# Patient Record
Sex: Male | Born: 1980 | Race: Black or African American | Hispanic: No | Marital: Single | Smoking: Never smoker
Health system: Southern US, Community
[De-identification: ages and names within clinical notes are randomized; demographics above are authoritative.]

---

## 2008-03-03 ENCOUNTER — Emergency Department (HOSPITAL_COMMUNITY): Admission: EM | Admit: 2008-03-03 | Discharge: 2008-03-03 | Payer: Self-pay | Admitting: Emergency Medicine

## 2009-02-15 ENCOUNTER — Emergency Department (HOSPITAL_COMMUNITY): Admission: EM | Admit: 2009-02-15 | Discharge: 2009-02-15 | Payer: Self-pay | Admitting: Emergency Medicine
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
## 2011-07-09 ENCOUNTER — Other Ambulatory Visit: Payer: Self-pay | Admitting: Family Medicine

## 2011-07-09 DIAGNOSIS — E049 Nontoxic goiter, unspecified: Secondary | ICD-10-CM

## 2011-07-11 ENCOUNTER — Ambulatory Visit
Admission: RE | Admit: 2011-07-11 | Discharge: 2011-07-11 | Disposition: A | Discharge status: Home / Self Care | Payer: 59 | Source: Ambulatory Visit | Attending: Family Medicine | Admitting: Family Medicine

## 2011-07-11 DIAGNOSIS — E049 Nontoxic goiter, unspecified: Secondary | ICD-10-CM

## 2015-09-15 ENCOUNTER — Ambulatory Visit (INDEPENDENT_AMBULATORY_CARE_PROVIDER_SITE_OTHER): Payer: 59 | Admitting: Family Medicine

## 2015-09-15 ENCOUNTER — Other Ambulatory Visit: Payer: Self-pay | Admitting: Family Medicine

## 2015-09-15 VITALS — BP 134/80 | HR 93 | Temp 98.6°F | Resp 18 | Ht 77.75 in | Wt 321.0 lb

## 2015-09-15 DIAGNOSIS — R369 Urethral discharge, unspecified: Secondary | ICD-10-CM | POA: Diagnosis not present

## 2015-09-15 DIAGNOSIS — J028 Acute pharyngitis due to other specified organisms: Secondary | ICD-10-CM

## 2015-09-15 DIAGNOSIS — J029 Acute pharyngitis, unspecified: Secondary | ICD-10-CM | POA: Insufficient documentation

## 2015-09-15 MED ORDER — CEFTRIAXONE SODIUM 1 G IJ SOLR
250.0000 mg | Freq: Once | INTRAMUSCULAR | Status: AC
  Administered 2015-09-15: 250 mg via INTRAMUSCULAR

## 2015-09-15 MED ORDER — AZITHROMYCIN 250 MG PO TABS: ORAL_TABLET | ORAL | Status: DC

## 2015-09-15 NOTE — Patient Instructions (Addendum)
Thank you for coming in today. Take azithromycin once for penile discharge.. Use Tylenol as needed for sore throat We will call with results.  Pharyngitis Pharyngitis is redness, pain, and swelling (inflammation) of your pharynx.  CAUSES  Pharyngitis is usually caused by infection. Most of the time, these infections are from viruses (viral) and are part of a cold. However, sometimes pharyngitis is caused by bacteria (bacterial). Pharyngitis can also be caused by allergies. Viral pharyngitis may be spread from person to person by coughing, sneezing, and personal items or utensils (cups, forks, spoons, toothbrushes). Bacterial pharyngitis may be spread from person to person by more intimate contact, such as kissing.  SIGNS AND SYMPTOMS  Symptoms of pharyngitis include:   Sore throat.   Tiredness (fatigue).   Low-grade fever.   Headache.  Joint pain and muscle aches.  Skin rashes.  Swollen lymph nodes.  Plaque-like film on throat or tonsils (often seen with bacterial pharyngitis). DIAGNOSIS  Your health care provider will ask you questions about your illness and your symptoms. Your medical history, along with a physical exam, is often all that is needed to diagnose pharyngitis. Sometimes, a rapid strep test is done. Other lab tests may also be done, depending on the suspected cause.  TREATMENT  Viral pharyngitis will usually get better in 3-4 days without the use of medicine. Bacterial pharyngitis is treated with medicines that kill germs (antibiotics).  HOME CARE INSTRUCTIONS   Drink enough water and fluids to keep your urine clear or pale yellow.   Only take over-the-counter or prescription medicines as directed by your health care provider:   If you are prescribed antibiotics, make sure you finish them even if you start to feel better.   Do not take aspirin.   Get lots of rest.   Gargle with 8 oz of salt water ( tsp of salt per 1 qt of water) as often as every  1-2 hours to soothe your throat.   Throat lozenges (if you are not at risk for choking) or sprays may be used to soothe your throat. SEEK MEDICAL CARE IF:   You have large, tender lumps in your neck.  You have a rash.  You cough up green, yellow-brown, or bloody spit. SEEK IMMEDIATE MEDICAL CARE IF:   Your neck becomes stiff.  You drool or are unable to swallow liquids.  You vomit or are unable to keep medicines or liquids down.  You have severe pain that does not go away with the use of recommended medicines.  You have trouble breathing (not caused by a stuffy nose). MAKE SURE YOU:   Understand these instructions.  Will watch your condition.  Will get help right away if you are not doing well or get worse.   This information is not intended to replace advice given to you by your health care provider. Make sure you discuss any questions you have with your health care provider.   Document Released: 02/11/2005 Document Revised: 12/02/2012 Document Reviewed: 10/19/2012 Elsevier Interactive Patient Education 2016 [employer]vier Inc[employer]  Chlamydia, Male Chlamydia is an infection. It is spread through sexual contact. Chlamydia can be in different areas of the body. These areas include the urethra, throat, or rectum. It is important to treat chlamydia as soon as possible. It can damage other organs.  CAUSES  Chlamydia is caused by bacteria. It is a sexually transmitted disease. This means that it is passed from an infected partner during intimate contact. This contact could be with the genitals, mouth,  or rectal area.  SIGNS AND SYMPTOMS  There may not be any symptoms. This is often the case early in the infection. If there are symptoms, they are usually mild and may only be noticeable in the morning. Symptoms you may notice include:   Burning with urination.  Pain or swelling in the testicles.  Watery mucus-like discharge from the penis.  Long-standing (chronic) pelvic pain after  frequent infections.  Pain, swelling, or itching around the anus.  A sore throat.  Itching, burning, or redness in the eyes, or discharge from the eyes. DIAGNOSIS  To diagnose this infection, your health care provider will do a pelvic exam. A sample of urine or a swab from the rectum may be taken for testing.  TREATMENT  Chlamydia is treated with antibiotic medicines. Your health care provider may test you for infection again 3 months after treatment. HOME CARE INSTRUCTIONS  Take your antibiotic medicine as directed by your health care provider. Finish the antibiotic even if you start to feel better. Incomplete treatment will put you at risk for not being able to have children (sterility).   Take medicines only as directed by your health care provider.   Rest.   Inform any sexual partners about your infection. Even if they are symptom free or have a negative culture or evaluation, they should be treated for the condition.   Do not have sex (intercourse) until treatment is completed and your health care provider says it is okay.   Keep all follow-up visits as directed by your health care provider.   Not all test results are available during your visit. If your test results are not back during the visit, make an appointment with your health care provider to find out the results. Do not assume everything is normal if you have not heard from your health care provider or the medical facility. It is your responsibility to get your test results. SEEK MEDICAL CARE IF:  You develop new joint pain.  You have a fever. SEEK IMMEDIATE MEDICAL CARE IF:   Your pain increases.   You have abnormal discharge.   You have pain during intercourse. MAKE SURE YOU:   Understand these instructions.  Will watch your condition.  Will get help right away if you are not doing well or get worse.   This information is not intended to replace advice given to you by your health care provider.  Make sure you discuss any questions you have with your health care provider.   Document Released: 02/11/2005 Document Revised: 03/04/2014 Document Reviewed: 08/20/2012 Elsevier Interactive Patient Education 2016 [employer].   IF you received an x-ray today, you will receive an invoice from [facility]. Please contact [facility] at [phone] with questions or concerns regarding your invoice.   IF you received labwork today, you will receive an invoice from [employer]. Please contact [facility] at [phone] with questions or concerns regarding your invoice.   Our billing staff will not be able to assist you with questions regarding bills from these companies.  You will be contacted with the lab results as soon as they are available. The fastest way to get your results is to activate your My Chart account. Instructions are located on the last page of this paperwork. If you have not heard from [place] regarding the results in 2 weeks, please contact this office.

## 2015-09-15 NOTE — Addendum Note (Signed)
Addended by: Arlyss QueenPACK, Hisayo Delossantos D on: 09/15/2015 03:47 PM   Modules accepted: Kipp BroodSmartSet

## 2015-09-15 NOTE — Progress Notes (Signed)
    Ruben Guzman is a 35 y.o. male who [facility] sore throat and penile discharge.  Patient notes a 4 day history of mild sore throat associated with some body aches. He notes pain with swallowing. He denies any fevers or chills nausea vomiting or diarrhea.He has not really tried any medications yet. She denies any significant cough.    Additionally patient has a one-day history of clear penile discharge. He notes the discharge is similar to previous episodes of Chlamydia.He denies any rash or lesions. He has not tried any medications or treatment.   History reviewed. No pertinent past medical history. History reviewed. No pertinent past surgical history. Social History  Substance Use Topics  . Smoking status: Never Smoker   . Smokeless tobacco: Not on file  . Alcohol Use: No   ROS as above No headache, visual changes, nausea, vomiting, diarrhea, constipation, dizziness, abdominal pain, skin rash, fevers, chills, night sweats, weight loss, swollen lymph nodes, body aches, joint swelling, muscle aches, chest pain, shortness of breath, mood changes, visual or auditory hallucinations.   Medications: Current Outpatient Prescriptions  Medication Sig Dispense Refill  . azithromycin (ZITHROMAX) 250 MG tablet Take 1000mg  po once 4 tablet 0   Current Facility-Administered Medications  Medication Dose Route Frequency Provider Last Rate Last Dose  . cefTRIAXone (ROCEPHIN) injection 250 mg  250 mg Intramuscular Once Rodolph BongEvan S Braxdon Gappa, MD       No Known Allergies   Exam:  BP 134/80 mmHg  Pulse 93  Temp(Src) 98.6 F (37 C) (Oral)  Resp 18  Ht 6' 5.75" (1.975 m)  Wt 321 lb (145.605 kg)  BMI 37.33 kg/m2  SpO2 98% Gen: Well NAD HEENT: EOMI,  MMM Posterior pharynx with cobblestoning. No erythema or exudate. Lungs: Normal work of breathing. CTABL Heart: RRR no MRG Abd: NABS, Soft. Nondistended, Nontender Exts: Brisk capillary refill, warm and well perfused.   Genitals: No inguinal LAD Penis is circumcised. Clear discharge is present Testicles are descended and non-tender BL.  No lesions noted.   Patient was given 250mg  IV ceftriaxone IM prior to DC.    No results found for this or any previous visit (from the past 24 hour(s)). No results found.  Assessment and Plan: 35 y.o. male with   Pharyngitis: Likely viral. Plan for symptomatic management with over-the-counter medications.  Penile discharge: Concerning for Chlamydia or gonorrhea. Urine GC chlamydia pending. Serum HIV and syphilis testing are also pending. Empiric treatment with ceftriaxone 250 mg IM once in the clinic as well as 1 g of oral azithromycin sent to the pharmacy. Contact patient with results.  Discussed warning signs or symptoms. Please see discharge instructions. Patient expresses understanding.

## 2015-09-16 LAB — GC/CHLAMYDIA PROBE AMP
CT Probe RNA: NOT DETECTED
GC Probe RNA: DETECTED — AB

## 2015-09-16 LAB — HIV ANTIBODY (ROUTINE TESTING W REFLEX): HIV: NONREACTIVE

## 2015-09-16 LAB — RPR

## 2015-09-20 ENCOUNTER — Telehealth: Payer: Self-pay | Admitting: Emergency Medicine

## 2015-09-20 NOTE — Telephone Encounter (Signed)
-----   Message from Rodolph Bong, MD sent at 09/18/2015  7:17 AM EDT ----- Gonorrhea was positive. The injection in the clinic plus the pills at the pharmacy should take care of this. Please inform her sexual partners of this result.

## 2015-10-11 ENCOUNTER — Ambulatory Visit (INDEPENDENT_AMBULATORY_CARE_PROVIDER_SITE_OTHER): Payer: 59 | Admitting: Urgent Care

## 2015-10-11 ENCOUNTER — Ambulatory Visit (INDEPENDENT_AMBULATORY_CARE_PROVIDER_SITE_OTHER): Payer: 59

## 2015-10-11 VITALS — BP 128/84 | HR 90 | Temp 98.2°F | Resp 17 | Ht 77.75 in | Wt 325.0 lb

## 2015-10-11 DIAGNOSIS — S99911A Unspecified injury of right ankle, initial encounter: Secondary | ICD-10-CM | POA: Diagnosis not present

## 2015-10-11 DIAGNOSIS — Z8739 Personal history of other diseases of the musculoskeletal system and connective tissue: Secondary | ICD-10-CM

## 2015-10-11 DIAGNOSIS — Z8639 Personal history of other endocrine, nutritional and metabolic disease: Secondary | ICD-10-CM | POA: Diagnosis not present

## 2015-10-11 DIAGNOSIS — M25571 Pain in right ankle and joints of right foot: Secondary | ICD-10-CM | POA: Diagnosis not present

## 2015-10-11 LAB — URIC ACID: URIC ACID, SERUM: 9.2 mg/dL — AB (ref 4.0–8.0)

## 2015-10-11 MED ORDER — NAPROXEN SODIUM 550 MG PO TABS: 550.0000 mg | ORAL_TABLET | Freq: Two times a day (BID) | ORAL | 1 refills | Status: DC

## 2015-10-11 NOTE — Patient Instructions (Addendum)
RICE for Routine Care of Injuries Theroutine careofmanyinjuriesincludes rest, ice, compression, and elevation (RICE therapy). RICE therapy is often recommended for injuries to soft tissues, such as a muscle strain, ligament injuries, bruises, and overuse injuries. It can also be used for some bony injuries. Using RICE therapy can help to relieve pain, lessen swelling, and enable your body to heal. Rest Rest is required to allow your body to heal. This usually involves reducing your normal activities and avoiding use of the injured part of your body. Generally, you can return to your normal activities when you are comfortable and have been given permission by your health care provider. Ice Icing your injury helps to keep the swelling down, and it lessens pain. Do not apply ice directly to your skin.  Put ice in a plastic bag.  Place a towel between your skin and the bag.  Leave the ice on for 20 minutes, 2-3 times a day. Do this for as long as you are directed by your health care provider. Compression Compression means putting pressure on the injured area. Compression helps to keep swelling down, gives support, and helps with discomfort. Compression may be done with an elastic bandage. If an elastic bandage has been applied, follow these general tips:  Remove and reapply the bandage every 3-4 hours or as directed by your health care provider.  Make sure the bandage is not wrapped too tightly, because this can cut off circulation. If part of your body beyond the bandage becomes blue, numb, cold, swollen, or more painful, your bandage is most likely too tight. If this occurs, remove your bandage and reapply it more loosely.  See your health care provider if the bandage seems to be making your problems worse rather than better. Elevation Elevation means keeping the injured area raised. This helps to lessen swelling and decrease pain. If possible, your injured area should be elevated at or  above the level of your heart or the center of your chest. WHEN SHOULD I SEEK MEDICAL CARE? You should seek medical care if:  Your pain and swelling continue.  Your symptoms are getting worse rather than improving. These symptoms may indicate that further evaluation or further X-rays are needed. Sometimes, X-rays may not show a small broken bone (fracture) until a number of days later. Make a follow-up appointment with your health care provider. WHEN SHOULD I SEEK IMMEDIATE MEDICAL CARE? You should seek immediate medical care if:  You have sudden severe pain at or below the area of your injury.  You have redness or increased swelling around your injury.  You have tingling or numbness at or below the area of your injury that does not improve after you remove the elastic bandage.   This information is not intended to replace advice given to you by your health care provider. Make sure you discuss any questions you have with your health care provider.   Document Released: 05/26/2000 Document Revised: 11/02/2014 Document Reviewed: 01/19/2014 Elsevier Interactive Patient Education 2016 Elsevier Inc.     IF you received an x-ray today, you will receive an invoice from [facility]. Please contact [facility] at [phone] with questions or concerns regarding your invoice.   IF you received labwork today, you will receive an invoice from [facility]. Please contact [facility] at [phone] with questions or concerns regarding your invoice.   Our billing staff will not be able to assist you with questions regarding bills from these companies.  You will be contacted with   the lab results as soon as they are available. The fastest way to get your results is to activate your My Chart account. Instructions are located on the last page of this paperwork. If you have not heard from us regarding the results in 2 weeks, please contact this office.      

## 2015-10-11 NOTE — Progress Notes (Signed)
    MRN: [id number] DOB: 11/03/1980  Subjective:   Ruben Guzman is a 35 y.o. male presenting for right ankle pain.  Reports 1.5 week history of rolling his right ankle. He has had persistent pain of his right ankle, swelling. Pain is a throbbing type sensation, worse at the end of the day after work. Works 12 hour shifts, stands and walks for most of his shift. He has been trying Alleve without any significant difference. He has not used any wraps, icing, elevated his foot. Denies erythema, bruising, trauma. Denies drinking alcohol.   Ruben Guzman currently has no medications in their medication list. Also has No Known Allergies.  Ruben Guzman  has no past medical history on file. Also  has no past surgical history on file.  Objective:   Vitals: BP (!) 138/96 (BP Location: Right Arm, Patient Position: Sitting, Cuff Size: Large)   Pulse 90   Temp 98.2 F (36.8 C) (Oral)   Resp 17   Ht 6' 5.75" (1.975 m)   Wt (!) 325 lb (147.4 kg)   SpO2 99%   BMI 37.80 kg/m   Physical Exam  Constitutional: He is oriented to person, place, and time. He appears well-developed and well-nourished.  Cardiovascular: Normal rate.   Pulmonary/Chest: Effort normal.  Musculoskeletal:       Right ankle: He exhibits normal range of motion, no swelling, no ecchymosis, no deformity, no laceration and normal pulse. Tenderness (over area depicted, there is also slight warmth compared to his left ankle). No lateral malleolus, no medial malleolus, no AITFL, no CF ligament, no posterior TFL, no head of 5th metatarsal and no proximal fibula tenderness found. Achilles tendon exhibits no pain and no defect.       Feet:  Neurological: He is alert and oriented to person, place, and time. Coordination (walking with limp favoring right foot) abnormal.  Skin: Skin is warm and dry.   Dg Ankle Complete Right  Result Date: 10/11/2015 CLINICAL DATA:  Pain.  Initial evaluation. EXAM: RIGHT ANKLE - COMPLETE 3+ VIEW COMPARISON:  No recent  prior . FINDINGS: No acute bony or joint abnormality identified. No evidence fracture dislocation. IMPRESSION: No acute abnormality. Electronically Signed   By: Maisie Fushomas  Register   On: 10/11/2015 11:33    Assessment and Plan :   1. Right ankle pain 2. Right ankle injury, initial encounter 3. History of gout - Labs pending. Radiographs were negative. Suspect ankle tendinitis versus mild ankle sprain versus (less likely) gout. Will manage conservatively. RTC in 2 weeks if no improvement.  Wallis BambergMario Alaina Donati, PA-C [facility] [phone] 10/11/2015 10:59 AM

## 2015-10-11 NOTE — Addendum Note (Signed)
Addended by: Wallis BambergMANI, Tyshan Enderle on: 10/11/2015 11:51 AM   Modules accepted: Orders

## 2015-10-12 ENCOUNTER — Other Ambulatory Visit: Payer: Self-pay | Admitting: Urgent Care

## 2015-10-12 ENCOUNTER — Encounter: Payer: Self-pay | Admitting: Urgent Care

## 2015-10-12 DIAGNOSIS — M109 Gout, unspecified: Secondary | ICD-10-CM

## 2015-10-12 LAB — CBC
HEMATOCRIT: 44.5 % (ref 38.5–50.0)
Hemoglobin: 15.3 g/dL (ref 13.2–17.1)
MCH: 31 pg (ref 27.0–33.0)
MCHC: 34.4 g/dL (ref 32.0–36.0)
MCV: 90.1 fL (ref 80.0–100.0)
MPV: 10.4 fL (ref 7.5–12.5)
Platelets: 198 10*3/uL (ref 140–400)
RBC: 4.94 MIL/uL (ref 4.20–5.80)
RDW: 13.9 % (ref 11.0–15.0)
WBC: 6 10*3/uL (ref 3.8–10.8)

## 2015-10-12 LAB — SEDIMENTATION RATE: Sed Rate: 5 mm/hr (ref 0–15)

## 2015-10-12 MED ORDER — INDOMETHACIN 50 MG PO CAPS: 50.0000 mg | ORAL_CAPSULE | Freq: Three times a day (TID) | ORAL | 6 refills | Status: DC

## 2017-08-07 IMAGING — DX DG ANKLE COMPLETE 3+V*R*
4 series · 4 of 4 positions shown · non-contrast
Comparison: No recent prior .

CLINICAL DATA: Pain.  Initial evaluation.

EXAM:
RIGHT ANKLE - COMPLETE 3+ VIEW

[ankle ap]
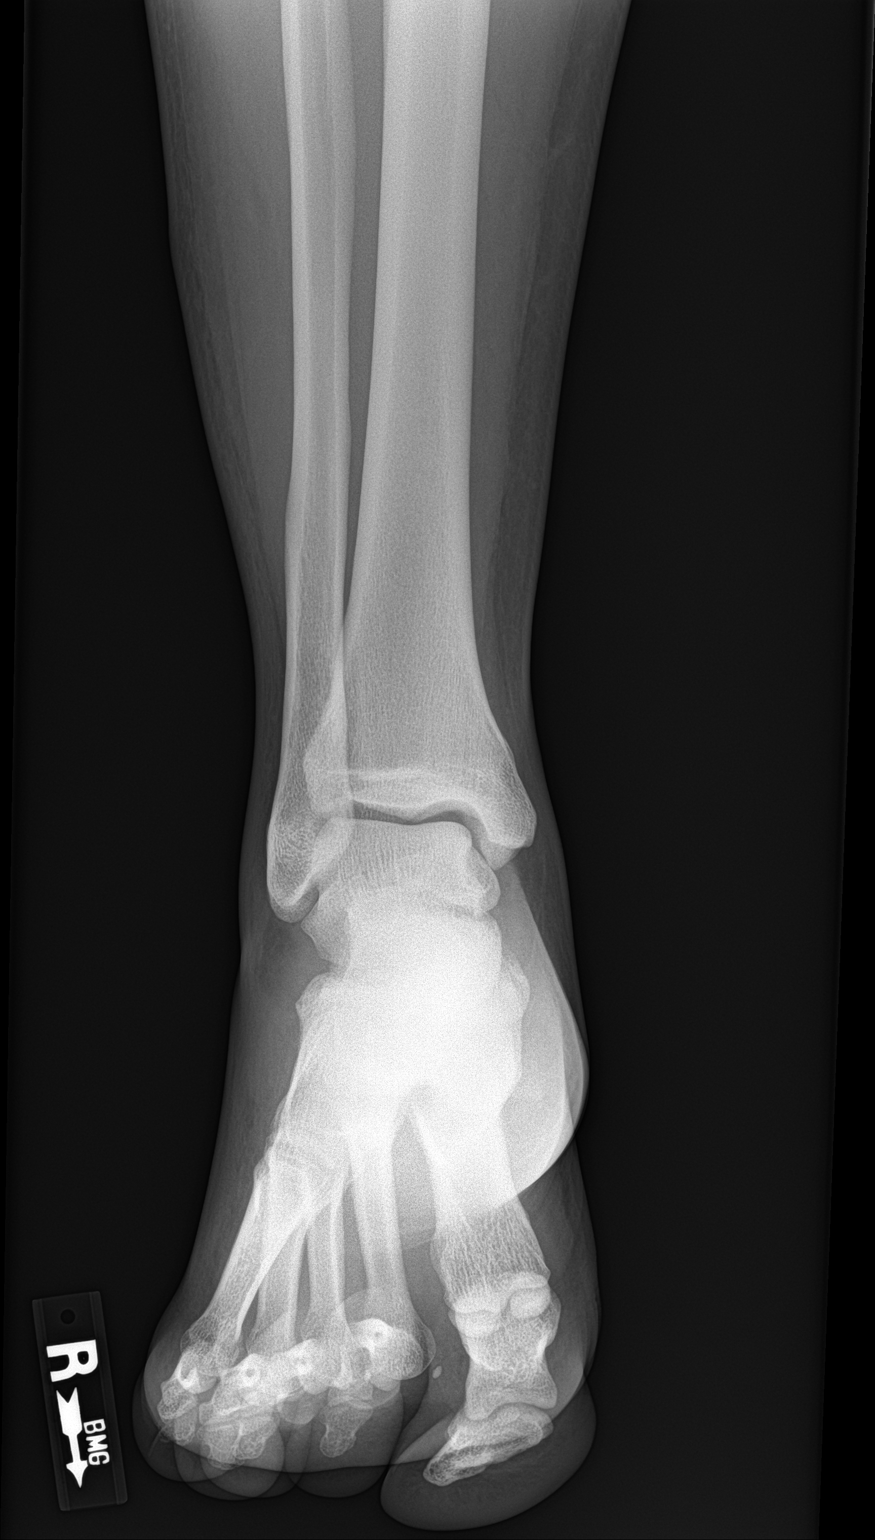

[ankle obl (1 of 2)]
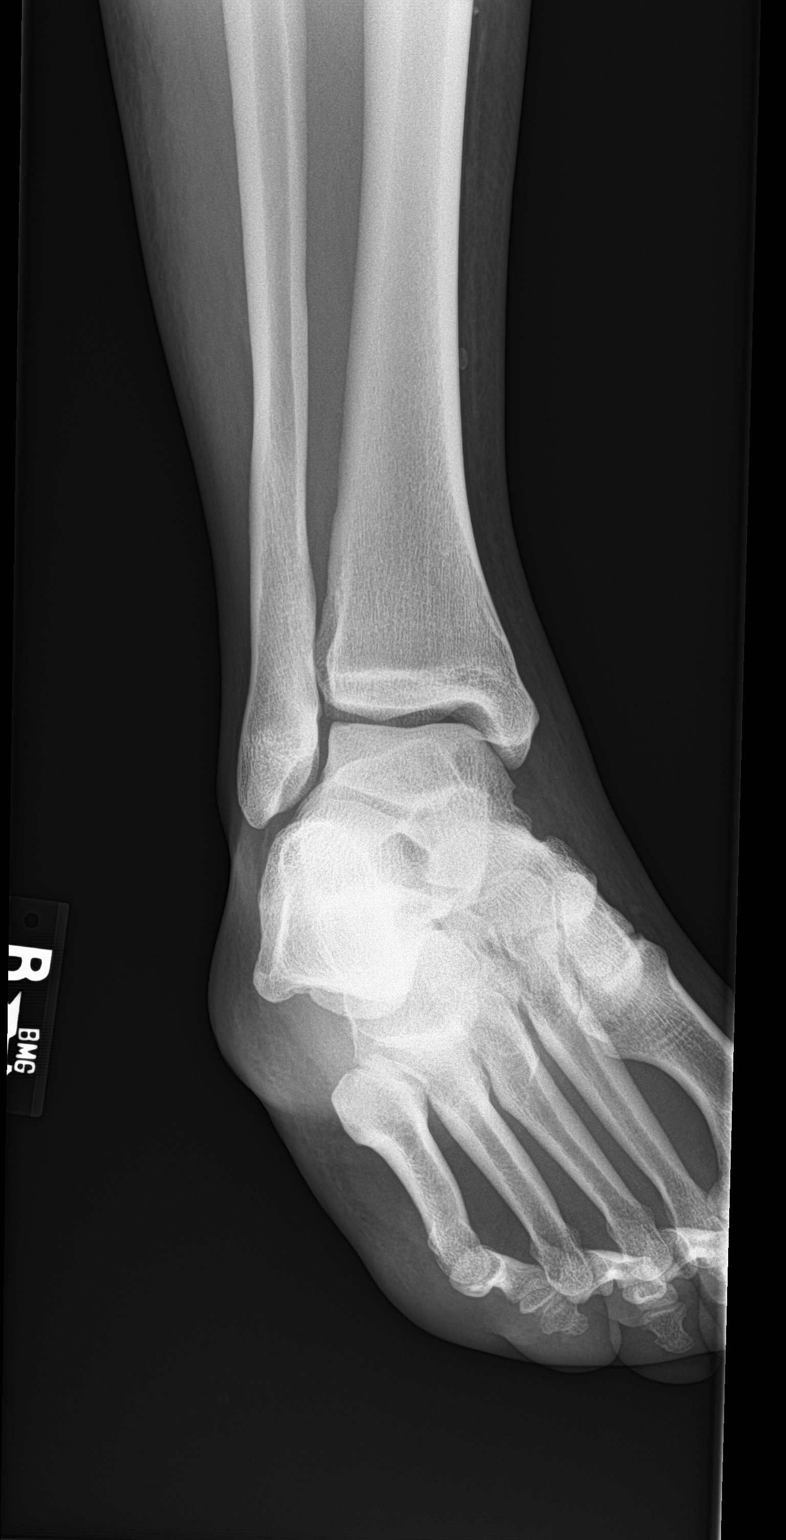

[ankle lat]
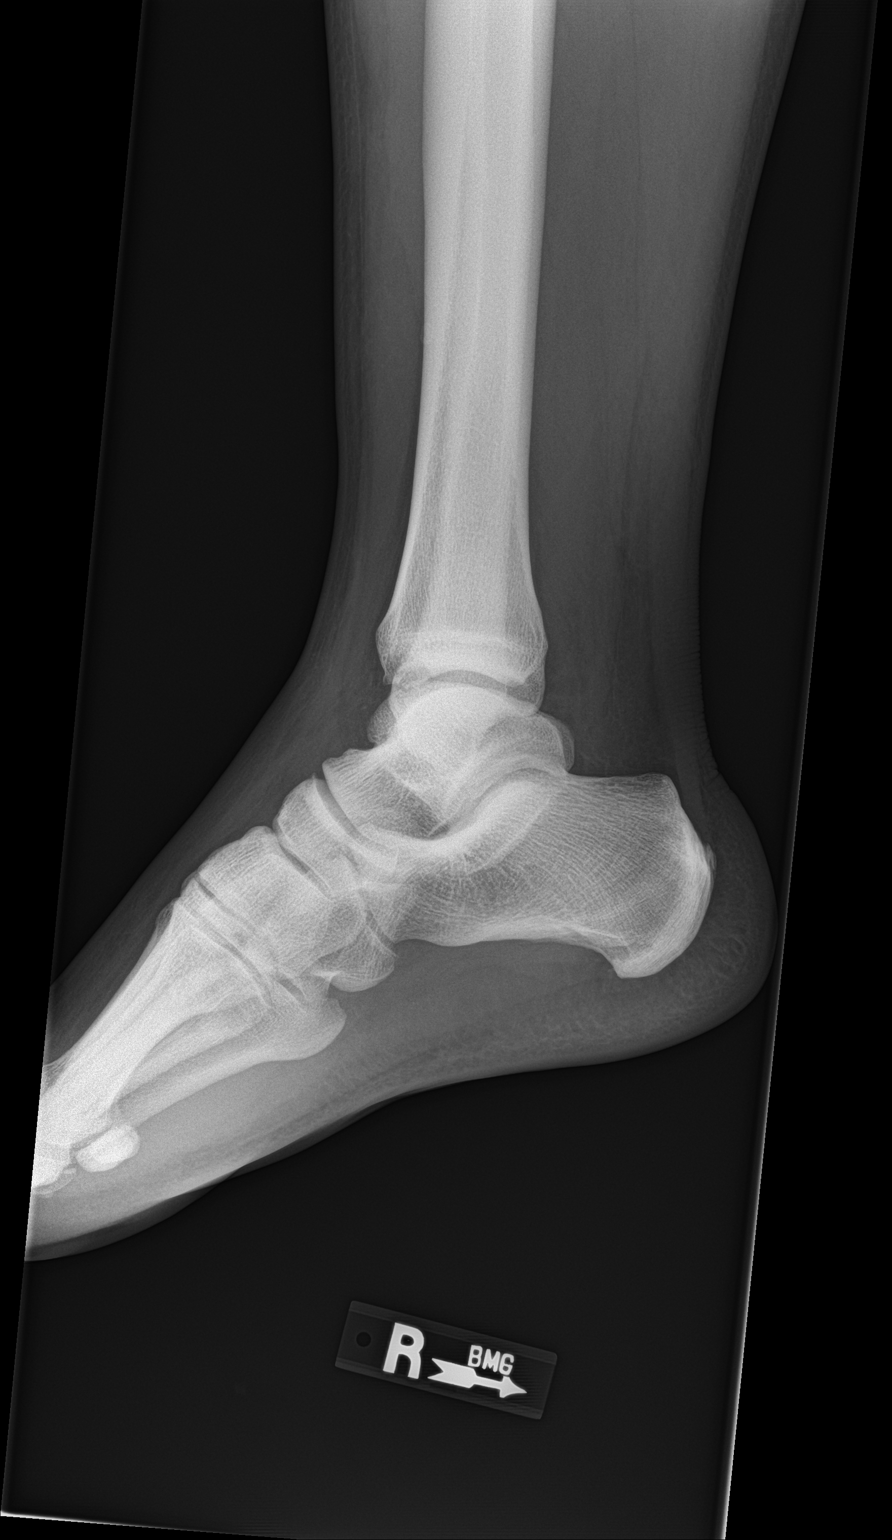

[ankle obl (2 of 2)]
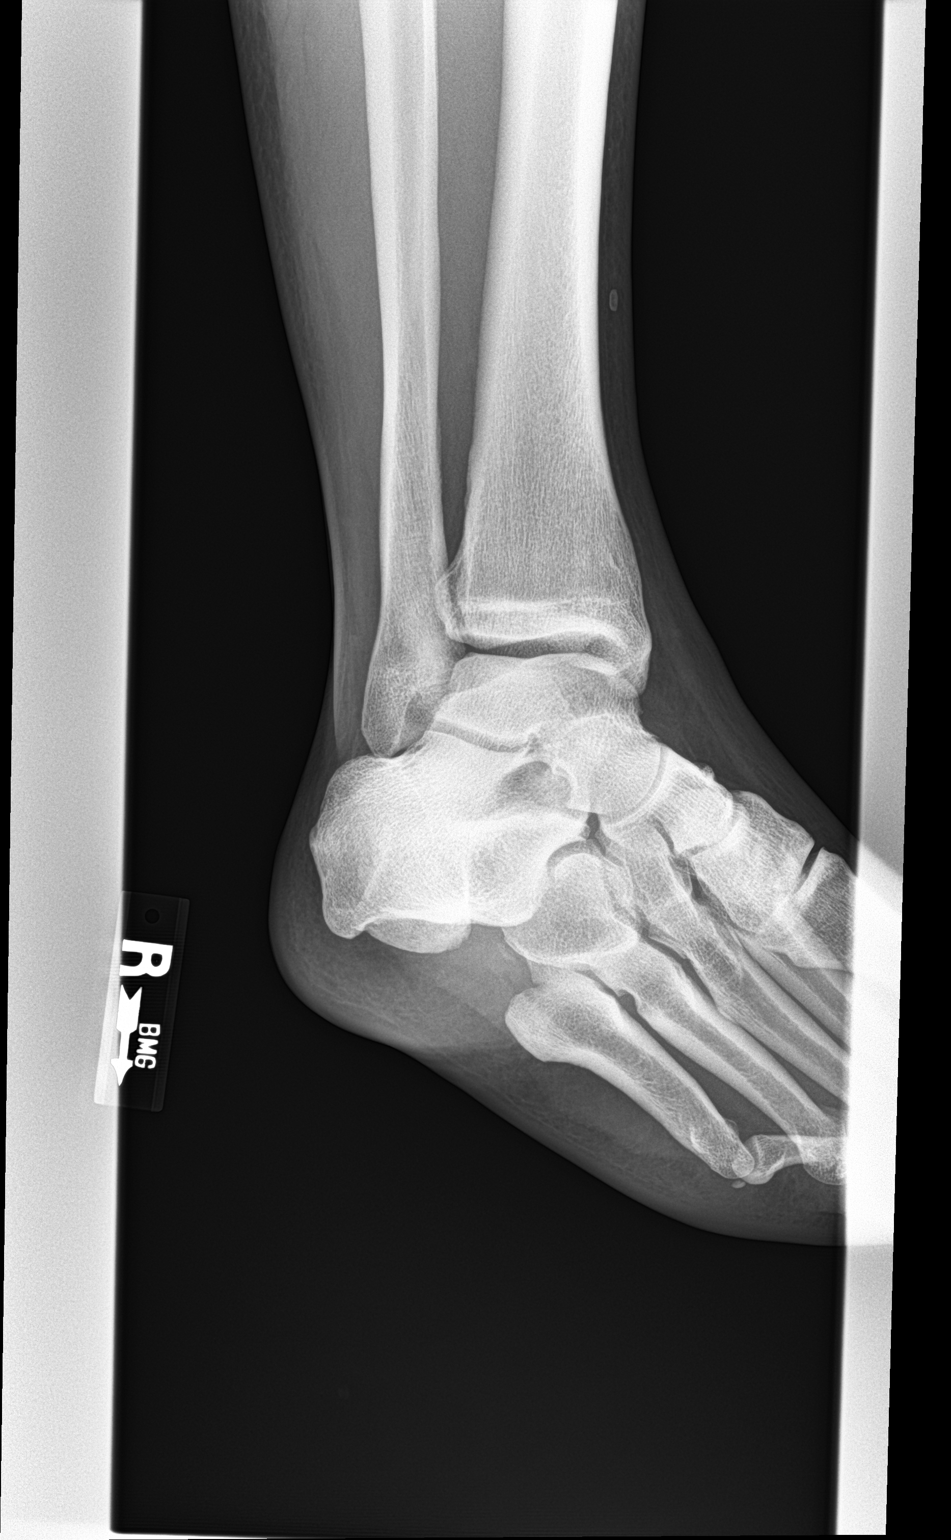

[4 of 4 positions shown; findings below may reference images not displayed]

FINDINGS: No acute bony or joint abnormality identified. No evidence fracture
dislocation.
IMPRESSION: No acute abnormality.

## 2019-01-16 ENCOUNTER — Ambulatory Visit (HOSPITAL_COMMUNITY)
Admission: EM | Admit: 2019-01-16 | Discharge: 2019-01-16 | Disposition: A | Discharge status: Home / Self Care | Payer: 59 | Attending: Family Medicine | Admitting: Family Medicine

## 2019-01-16 ENCOUNTER — Other Ambulatory Visit: Payer: Self-pay

## 2019-01-16 ENCOUNTER — Encounter (HOSPITAL_COMMUNITY): Payer: Self-pay

## 2019-01-16 DIAGNOSIS — R030 Elevated blood-pressure reading, without diagnosis of hypertension: Secondary | ICD-10-CM

## 2019-01-16 DIAGNOSIS — S39012A Strain of muscle, fascia and tendon of lower back, initial encounter: Secondary | ICD-10-CM

## 2019-01-16 MED ORDER — CYCLOBENZAPRINE HCL 10 MG PO TABS: ORAL_TABLET | ORAL | 0 refills | Status: AC

## 2019-01-16 MED ORDER — DICLOFENAC SODIUM 75 MG PO TBEC: 75.0000 mg | DELAYED_RELEASE_TABLET | Freq: Two times a day (BID) | ORAL | 0 refills | Status: AC

## 2019-01-16 NOTE — Discharge Instructions (Addendum)
HOME CARE INSTRUCTIONS: For many people, back pain returns. Since low back pain is rarely dangerous, it is often a condition that people can learn to manage on their own. Please remain active. It is stressful on the back to sit or stand in one place. Do not sit, drive, or stand in one place for more than 30 minutes at a time. Take short walks on level surfaces as soon as pain allows. Try to increase the length of time you walk each day. Do not stay in bed. Resting more than 1 or 2 days can delay your recovery. Do not avoid exercise or work. Your body is made to move. It is not dangerous to be active, even though your back may hurt. Your back will likely heal faster if you return to being active before your pain is gone. Over-the-counter medicines to reduce pain and inflammation are often the most helpful.  SEEK MEDICAL CARE IF: You have pain that is not relieved with rest or medicine. You have pain that does not improve in 1 week. You have new symptoms. You are generally not feeling well.  SEEK IMMEDIATE MEDICAL CARE IF: You have pain that radiates from your back into your legs. You develop new bowel or bladder control problems. You have unusual weakness or numbness in your arms or legs. You develop nausea or vomiting. You develop abdominal pain. You feel faint.  Your blood pressure was noted to be elevated during your visit today. You may return here within the next few days to recheck if unable to see your primary care doctor. If your blood pressure remains persistently elevated, you may need to begin taking a medication.  BP (!) 163/87 (BP Location: Right Arm)    Pulse 90    Temp 98.5 F (36.9 C) (Oral)    Resp 18    SpO2 99%
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
## 2022-06-13 ENCOUNTER — Other Ambulatory Visit: Payer: Self-pay | Admitting: Internal Medicine

## 2022-06-13 ENCOUNTER — Ambulatory Visit
Admission: RE | Admit: 2022-06-13 | Discharge: 2022-06-13 | Disposition: A | Discharge status: Home / Self Care | Payer: 59 | Source: Ambulatory Visit | Attending: Internal Medicine | Admitting: Internal Medicine

## 2022-06-13 DIAGNOSIS — M25561 Pain in right knee: Secondary | ICD-10-CM

## 2022-06-13 DIAGNOSIS — M25562 Pain in left knee: Secondary | ICD-10-CM

## 2022-08-13 ENCOUNTER — Ambulatory Visit: Payer: 59 | Admitting: Orthopaedic Surgery

## 2022-08-13 DIAGNOSIS — M25562 Pain in left knee: Secondary | ICD-10-CM

## 2022-08-13 DIAGNOSIS — G8929 Other chronic pain: Secondary | ICD-10-CM

## 2022-08-13 DIAGNOSIS — M25561 Pain in right knee: Secondary | ICD-10-CM | POA: Diagnosis not present

## 2022-08-13 NOTE — Progress Notes (Signed)
Office Visit Note   Patient: Ruben Guzman           Date of Birth: 1980-04-27           MRN: [id number] Visit Date: 08/13/2022              Requested by: Collene Mares, PA [address] [place],  [place] [address] PCP: Hyacinth Meeker, [place], [place]   Assessment & Plan: Visit Diagnoses:  1. Chronic pain of both knees     Plan: Impression is 42 year old gentleman with bilateral patellofemoral pain.  Etiology for this explained to the patient and he will work on strengthening as well as weight loss.  Recommend aquatic therapy.  Questions encouraged and answered.  Follow-up as needed.  Follow-Up Instructions: No follow-ups on file.   Orders:  No orders of the defined types were placed in this encounter.  No orders of the defined types were placed in this encounter.     Procedures: No procedures performed   Clinical Data: No additional findings.   Subjective: Chief Complaint  Patient presents with   Right Knee - Pain   Left Knee - Pain    HPI Patient is a very send 42 year old gentleman here for evaluation of bilateral knee pain for about 4 months.  The pain is worse with using stairs.  He mainly feels anterior knee pain.  Meloxicam does provide relief.  Denies any recent injuries or changes in activity.  He states that his right knee has been hurting since he started playing football and has not really gotten better.  Review of Systems  Constitutional: Negative.   HENT: Negative.    Eyes: Negative.   Respiratory: Negative.    Cardiovascular: Negative.   Gastrointestinal: Negative.   Endocrine: Negative.   Genitourinary: Negative.   Skin: Negative.   Allergic/Immunologic: Negative.   Neurological: Negative.   Hematological: Negative.   Psychiatric/Behavioral: Negative.    All other systems reviewed and are negative.    Objective: Vital Signs: There were no vitals taken for this visit.  Physical Exam Vitals and nursing note reviewed.   Constitutional:      Appearance: He is well-developed.  HENT:     Head: Normocephalic and atraumatic.  Eyes:     Pupils: Pupils are equal, round, and reactive to light.  Pulmonary:     Effort: Pulmonary effort is normal.  Abdominal:     Palpations: Abdomen is soft.  Musculoskeletal:        General: Normal range of motion.     Cervical back: Neck supple.  Skin:    General: Skin is warm.  Neurological:     Mental Status: He is alert and oriented to person, place, and time.  Psychiatric:        Behavior: Behavior normal.        Thought Content: Thought content normal.        Judgment: Judgment normal.     Ortho Exam Examination bilateral knee shows no tenderness.  No joint effusion.  Normal range of motion.  Collaterals and cruciates are stable. Specialty Comments:  No specialty comments available.  Imaging: No results found.   PMFS History: Patient Active Problem List   Diagnosis Date Noted   Acute pharyngitis 09/15/2015   Penile discharge 09/15/2015   No past medical history on file.  Family History  Problem Relation Age of Onset   Cancer Mother    Hyperlipidemia Mother     No past surgical history  on file. Social History   Occupational History   Not on file  Tobacco Use   Smoking status: Never   Smokeless tobacco: Not on file  Substance and Sexual Activity   Alcohol use: No    Alcohol/week: 0.0 standard drinks of alcohol   Drug use: No   Sexual activity: Not on file
